# Patient Record
Sex: Male | Born: 2000 | Race: White | Hispanic: No | Marital: Single | Smoking: Former smoker
Health system: Southern US, Community
[De-identification: ages and names within clinical notes are randomized; demographics above are authoritative.]

## PROBLEM LIST (undated history)

## (undated) DIAGNOSIS — R631 Polydipsia: Secondary | ICD-10-CM

## (undated) DIAGNOSIS — F419 Anxiety disorder, unspecified: Secondary | ICD-10-CM

## (undated) DIAGNOSIS — F329 Major depressive disorder, single episode, unspecified: Secondary | ICD-10-CM

## (undated) DIAGNOSIS — R058 Other specified cough: Secondary | ICD-10-CM

## (undated) DIAGNOSIS — M791 Myalgia, unspecified site: Secondary | ICD-10-CM

## (undated) DIAGNOSIS — F32A Depression, unspecified: Secondary | ICD-10-CM

## (undated) DIAGNOSIS — H9190 Unspecified hearing loss, unspecified ear: Secondary | ICD-10-CM

## (undated) DIAGNOSIS — R05 Cough: Secondary | ICD-10-CM

## (undated) HISTORY — DX: Polydipsia: R63.1

## (undated) HISTORY — DX: Depression, unspecified: F32.A

## (undated) HISTORY — DX: Other specified cough: R05.8

## (undated) HISTORY — DX: Myalgia, unspecified site: M79.10

## (undated) HISTORY — DX: Unspecified hearing loss, unspecified ear: H91.90

## (undated) HISTORY — DX: Anxiety disorder, unspecified: F41.9

---
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
## 2020-04-17 ENCOUNTER — Encounter: Payer: Self-pay | Admitting: Family Medicine

## 2020-04-17 ENCOUNTER — Other Ambulatory Visit: Payer: Self-pay

## 2020-04-17 ENCOUNTER — Ambulatory Visit (INDEPENDENT_AMBULATORY_CARE_PROVIDER_SITE_OTHER): Payer: Managed Care, Other (non HMO) | Admitting: Family Medicine

## 2020-04-17 DIAGNOSIS — N50819 Testicular pain, unspecified: Secondary | ICD-10-CM

## 2020-04-17 NOTE — Assessment & Plan Note (Signed)
Appear to have R hydrocele.  No solid nodules or cystic structures palpated Recommend wearing supportive underwear.  Let me know if having new or worsening symptoms.

## 2020-04-17 NOTE — Patient Instructions (Signed)
It you notice increased swelling, pain or any firm nodules on the testicle please let me know.    Hydrocele, Adult A hydrocele is a collection of fluid in the loose pouch of skin that holds the testicles (scrotum). This may happen because:  The amount of fluid produced in the scrotum is not absorbed by the rest of the body.  Fluid from the abdomen fills the scrotum. Normally, the testicles develop in the abdomen then move (drop) into to the scrotum before birth. The tube that the testicles travel through usually closes after the testicles drop. If the tube does not close, fluid from the abdomen can fill the scrotum. This is less common in adults. What are the causes? The cause of a hydrocele in adults is usually not known. However, it may be caused by:  An injury to the scrotum.  An infection (epididymitis).  Decreased blood flow to the scrotum.  Twisting of a testicle (testicular torsion).  A birth defect.  A tumor or cancer of the testicle. What are the signs or symptoms? A hydrocele feels like a water-filled balloon. It may also feel heavy. Other symptoms include:  Swelling of the scrotum. The swelling may decrease when you lie down. You may also notice more swelling at night than in the morning.  Swelling of the groin.  Mild discomfort in the scrotum.  Pain. This can develop if the hydrocele was caused by infection or twisting. The larger the hydrocele, the more likely you are to have pain. How is this diagnosed? This condition may be diagnosed based on:  Physical exam.  Medical history. You may also have other tests, including:  Imaging tests, such as ultrasound.  Blood or urine tests. How is this treated? Most hydroceles go away on their own. If you have no discomfort or pain, your health care provider may suggest close monitoring of your condition (called watch and wait or watchful waiting) until the condition goes away or symptoms develop. If treatment is needed,  it may include:  Treating an underlying condition. This may include using an antibiotic medicine to treat an infection.  Surgery to stop fluid from collecting in the scrotum.  Surgery to drain the fluid. Options include: ? Needle aspiration. A needle is used to drain fluid. However, the fluid buildup will come back quickly. ? Hydrocelectomy. For this procedure, an incision is made in the scrotum to remove the fluid sac. Follow these instructions at home:  Watch the hydrocele for any changes.  Take over-the-counter and prescription medicines only as told by your health care provider.  If you were prescribed an antibiotic medicine, use it as told by your health care provider. Do not stop taking the antibiotic even if you start to feel better.  Keep all follow-up visits as told by your health care provider. This is important. Contact a health care provider if:  You notice any changes in the hydrocele.  The swelling in your scrotum or groin gets worse.  The hydrocele becomes red, firm, painful, or tender to the touch.  You have a fever. Get help right away if you:  Develop a lot of pain, or your pain becomes worse. Summary  A hydrocele is a collection of fluid in the loose pouch of skin that holds the testicles (scrotum).  Hydroceles can cause swelling, discomfort, and sometimes pain.  In adults, the cause of a hydrocele usually is not known. However, it is sometimes caused by an infection or a rotation and twisting of the  scrotum.  Treatment is usually not needed. Hydroceles often go away on their own. If a hydrocele causes pain, treatment may be given to ease the pain. This information is not intended to replace advice given to you by your health care provider. Make sure you discuss any questions you have with your health care provider. Document Revised: 12/23/2017 Document Reviewed: 12/23/2017 Elsevier Patient Education  2020 [employer].

## 2020-04-17 NOTE — Progress Notes (Signed)
Aaron Nicholson - 19 y.o. male MRN [id number]  Date of birth: 03/02/2001  Subjective Chief Complaint  Patient presents with  . New Patient (Initial Visit)    HPI Aaron Nicholson is a 19 y.o. male here today for initial visit.  He has complaint of testicular abnormality today.  Noticed a few weeks ago.  Not sure how long it has been present.  Area is above R testicle.  Soft, squishy feeling.  Denies pain but does have some discomfort if doing a lot of physical work.  He denies urinary symptoms, penile discharge, painful erections.  He has not had fever or chills.  He is adopted so he does not know family history.   ROS:  A comprehensive ROS was completed and negative except as noted per HPI  No Known Allergies  No past medical history on file.  No past surgical history on file.  Social History   Socioeconomic History  . Marital status: Single    Spouse name: Not on file  . Number of children: Not on file  . Years of education: Not on file  . Highest education level: Not on file  Occupational History  . Not on file  Tobacco Use  . Smoking status: Former Research scientist (life sciences)  . Smokeless tobacco: Never Used  Substance and Sexual Activity  . Alcohol use: No  . Drug use: No    Comment: Pt was ambiguous  . Sexual activity: Never  Other Topics Concern  . Not on file  Social History Narrative   Adopted ? 05/2004   Family History unknown mom was 71 at delivery   NGFS no concerns   HH of 4   Pet kittens      Social Determinants of Radio broadcast assistant Strain:   . Difficulty of Paying Living Expenses:   Food Insecurity:   . Worried About Charity fundraiser in the Last Year:   . Arboriculturist in the Last Year:   Transportation Needs:   . Film/video editor (Medical):   Marland Kitchen Lack of Transportation (Non-Medical):   Physical Activity:   . Days of Exercise per Week:   . Minutes of Exercise per Session:   Stress:   . Feeling of Stress :   Social Connections:   .  Frequency of Communication with Friends and Family:   . Frequency of Social Gatherings with Friends and Family:   . Attends Religious Services:   . Active Member of Clubs or Organizations:   . Attends Archivist Meetings:   Marland Kitchen Marital Status:     Family History  Adopted: Yes  Family history unknown: Yes    Health Maintenance  Topic Date Due  . HIV Screening  Never done  . COVID-19 Vaccine (1) Never done  . INFLUENZA VACCINE  07/26/2020  . TETANUS/TDAP  08/15/2021     ----------------------------------------------------------------------------------------------------------------------------------------------------------------------------------------------------------------- Physical Exam BP 111/77   Pulse 84   Temp (!) 97.4 F (36.3 C) (Oral)   Ht 5' 8.5" (1.74 m)   Wt 186 lb (84.4 kg)   BMI 27.87 kg/m   Physical Exam Constitutional:      Appearance: Normal appearance.  HENT:     Head: Normocephalic and atraumatic.  Cardiovascular:     Rate and Rhythm: Normal rate and regular rhythm.  Pulmonary:     Effort: Pulmonary effort is normal.     Breath sounds: Normal breath sounds.  Abdominal:     Palpations: Abdomen is soft.  Genitourinary:    Comments: Ballotable area around/adjacent to R testicle consistent with hydrocele.  No nodules on testicle. Epididymis is non tender.  Skin:    General: Skin is warm and dry.  Neurological:     General: No focal deficit present.     Mental Status: He is alert.  Psychiatric:        Mood and Affect: Mood normal.        Behavior: Behavior normal.     ------------------------------------------------------------------------------------------------------------------------------------------------------------------------------------------------------------------- Assessment and Plan  Testicular discomfort Appear to have R hydrocele.  No solid nodules or cystic structures palpated Recommend wearing supportive underwear.   Let me know if having new or worsening symptoms.    No orders of the defined types were placed in this encounter.   No follow-ups on file.    This visit occurred during the SARS-CoV-2 public health emergency.  Safety protocols were in place, including screening questions prior to the visit, additional usage of staff PPE, and extensive cleaning of exam room while observing appropriate contact time as indicated for disinfecting solutions.

## 2020-09-03 ENCOUNTER — Ambulatory Visit (INDEPENDENT_AMBULATORY_CARE_PROVIDER_SITE_OTHER): Payer: Managed Care, Other (non HMO) | Admitting: Family Medicine

## 2020-09-03 ENCOUNTER — Other Ambulatory Visit: Payer: Self-pay

## 2020-09-03 ENCOUNTER — Ambulatory Visit (INDEPENDENT_AMBULATORY_CARE_PROVIDER_SITE_OTHER): Payer: Managed Care, Other (non HMO)

## 2020-09-03 ENCOUNTER — Encounter: Payer: Self-pay | Admitting: Family Medicine

## 2020-09-03 VITALS — BP 144/72 | HR 90 | Wt 172.6 lb

## 2020-09-03 DIAGNOSIS — N50812 Left testicular pain: Secondary | ICD-10-CM | POA: Diagnosis not present

## 2020-09-03 DIAGNOSIS — N50819 Testicular pain, unspecified: Secondary | ICD-10-CM | POA: Diagnosis not present

## 2020-09-03 DIAGNOSIS — Z113 Encounter for screening for infections with a predominantly sexual mode of transmission: Secondary | ICD-10-CM

## 2020-09-03 NOTE — Assessment & Plan Note (Signed)
He has some mild epididymal tenderness.  No solid masses noted.  Given recurrent pain, will obtain US with doppler.  Check GC/chlamydia.  If no significant structural abnormalities noted on [place] will cover for epididymitis with doxycycline.

## 2020-09-03 NOTE — Progress Notes (Signed)
Aaron Nicholson - 19 y.o. male MRN [id number]  Date of birth: 24-Jul-2001  Subjective Chief Complaint  Patient presents with  . Testicle Pain    HPI Aaron Nicholson is a 19 y.o. male here today with complaint of testicular pain.  He was seen in 03/2020 with some mild discomfort but no pain at that time.  We discussed [place] at that time but decided to just monitor for now.  He denies any other symptoms including fever, chills, nausea or vomiting, penile discharge.   ROS:  A comprehensive ROS was completed and negative except as noted per HPI  No Known Allergies  Past Medical History:  Diagnosis Date  . Anxiety   . Depression   . Dry cough   . Increased thirst   . Muscle pain   . Perceived hearing changes     History reviewed. No pertinent surgical history.  Social History   Socioeconomic History  . Marital status: Single    Spouse name: Not on file  . Number of children: Not on file  . Years of education: Not on file  . Highest education level: Not on file  Occupational History  . Not on file  Tobacco Use  . Smoking status: Former Games developer  . Smokeless tobacco: Never Used  Vaping Use  . Vaping Use: Some days  Substance and Sexual Activity  . Alcohol use: No  . Drug use: Yes    Types: Marijuana  . Sexual activity: Not Currently    Partners: Female    Birth control/protection: Condom  Other Topics Concern  . Not on file  Social History Narrative   Adopted ? 05/2004   Family History unknown mom was 18 at delivery   NGFS no concerns   HH of 4   Pet kittens      Social Determinants of Health   Financial Resource Strain:   . Difficulty of Paying Living Expenses: Not on file  Food Insecurity:   . Worried About Programme researcher, broadcasting/film/video in the Last Year: Not on file  . Ran Out of Food in the Last Year: Not on file  Transportation Needs:   . Lack of Transportation (Medical): Not on file  . Lack of Transportation (Non-Medical): Not on file  Physical Activity:   .  Days of Exercise per Week: Not on file  . Minutes of Exercise per Session: Not on file  Stress:   . Feeling of Stress : Not on file  Social Connections:   . Frequency of Communication with Friends and Family: Not on file  . Frequency of Social Gatherings with Friends and Family: Not on file  . Attends Religious Services: Not on file  . Active Member of Clubs or Organizations: Not on file  . Attends Banker Meetings: Not on file  . Marital Status: Not on file    Family History  Adopted: Yes  Family history unknown: Yes    Health Maintenance  Topic Date Due  . Hepatitis C Screening  Never done  . INFLUENZA VACCINE  Never done  . COVID-19 Vaccine (1) 11/25/2020 (Originally 06/23/2012)  . TETANUS/TDAP  08/15/2021  . HIV Screening  Discontinued     ----------------------------------------------------------------------------------------------------------------------------------------------------------------------------------------------------------------- Physical Exam BP (!) 144/72 (BP Location: Left Arm, Patient Position: Sitting, Cuff Size: Normal)   Pulse 90   Wt 172 lb 9.6 oz (78.3 kg)   SpO2 99%   BMI 25.86 kg/m   Physical Exam Constitutional:      Appearance: Normal  appearance.  HENT:     Head: Normocephalic and atraumatic.  Cardiovascular:     Rate and Rhythm: Normal rate and regular rhythm.  Pulmonary:     Effort: Pulmonary effort is normal.     Breath sounds: Normal breath sounds.  Genitourinary:    Comments: Testicle without mass or significant ttp. He does have some mild epididymal tenderness.  No hernias noted.   Fordyce spots noted on shaft of penis (he was concerned about these).   Neurological:     General: No focal deficit present.     Mental Status: He is alert.  Psychiatric:        Mood and Affect: Mood normal.        Behavior: Behavior normal.      ------------------------------------------------------------------------------------------------------------------------------------------------------------------------------------------------------------------- Assessment and Plan  Left testicular pain He has some mild epididymal tenderness.  No solid masses noted.  Given recurrent pain, will obtain US with doppler.  Check GC/chlamydia.  If no significant structural abnormalities noted on [place] will cover for epididymitis with doxycycline.     No orders of the defined types were placed in this encounter.  Orders Placed This Encounter  Procedures  . Chlamydia/Neisseria Gonorrhoeae RNA,TMA,Urogenital  . US SCROTUM W/DOPPLER    Standing Status:   Future    Standing Expiration Date:   09/03/2021    Order Specific Question:   Reason for Exam (SYMPTOM  OR DIAGNOSIS REQUIRED)    Answer:   Left testicular pain    Order Specific Question:   Preferred imaging location?    Answer:   [facility]  . HIV antibody (with reflex)  . RPR     No follow-ups on file.    This visit occurred during the SARS-CoV-2 public health emergency.  Safety protocols were in place, including screening questions prior to the visit, additional usage of staff PPE, and extensive cleaning of exam room while observing appropriate contact time as indicated for disinfecting solutions.

## 2020-09-04 ENCOUNTER — Other Ambulatory Visit: Payer: Self-pay | Admitting: Family Medicine

## 2020-09-04 MED ORDER — DOXYCYCLINE HYCLATE 100 MG PO TABS: 100.0000 mg | ORAL_TABLET | Freq: Two times a day (BID) | ORAL | 0 refills | Status: DC

## 2020-09-05 LAB — SYPHILIS: RPR W/REFLEX TO RPR TITER AND TREPONEMAL ANTIBODIES, TRADITIONAL SCREENING AND DIAGNOSIS ALGORITHM: RPR Ser Ql: NONREACTIVE

## 2020-09-05 LAB — CHLAMYDIA/NEISSERIA GONORRHOEAE RNA,TMA,UROGENTIAL
C. trachomatis RNA, TMA: NOT DETECTED
N. gonorrhoeae RNA, TMA: NOT DETECTED

## 2020-09-05 LAB — HIV ANTIBODY (ROUTINE TESTING W REFLEX): HIV 1&2 Ab, 4th Generation: NONREACTIVE

## 2020-12-14 ENCOUNTER — Other Ambulatory Visit: Payer: Self-pay

## 2020-12-14 ENCOUNTER — Other Ambulatory Visit: Payer: Self-pay | Admitting: Family Medicine

## 2020-12-14 MED ORDER — DOXYCYCLINE HYCLATE 100 MG PO TABS
100.0000 mg | ORAL_TABLET | Freq: Two times a day (BID) | ORAL | 0 refills | Status: DC
Start: 2020-12-14 — End: 2021-03-24

## 2020-12-14 NOTE — Telephone Encounter (Signed)
Pt is requesting re-evaluation of med refill.   Advised pt the refill had been denied. He says, "I think that I still have this problem because I threw up most of the medication before. Can you please ask him to refill it for me. It was working but not enough of it was kept in my system."  Please advise.

## 2020-12-14 NOTE — Telephone Encounter (Signed)
Patient has been advised of refill stipulations and conditions. Agrees to be seen if not improving after this round.

## 2021-02-10 IMAGING — US US SCROTUM W/ DOPPLER COMPLETE
1 series · 14 of 25 positions shown · non-contrast
Comparison: None.

CLINICAL DATA: Left testicular pain worsening since [DATE]

EXAM:
SCROTAL ULTRASOUND
DOPPLER ULTRASOUND OF THE TESTICLES
TECHNIQUE: Complete ultrasound examination of the testicles, epididymis, and
other scrotal structures was performed. Color and spectral Doppler
ultrasound were also utilized to evaluate blood flow to the
testicles.

[Series 1: us scrotum w/ doppler complete · 0.06mm/px · 14 of 57 slices shown]
[im 1/57]
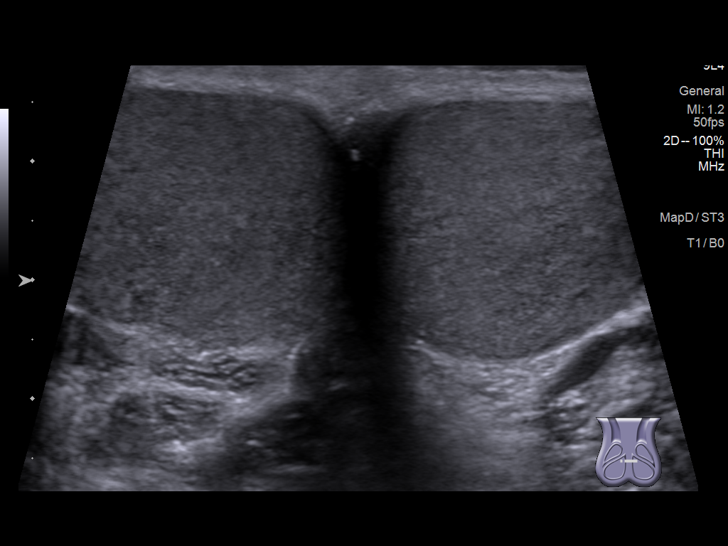
[im 5/57]
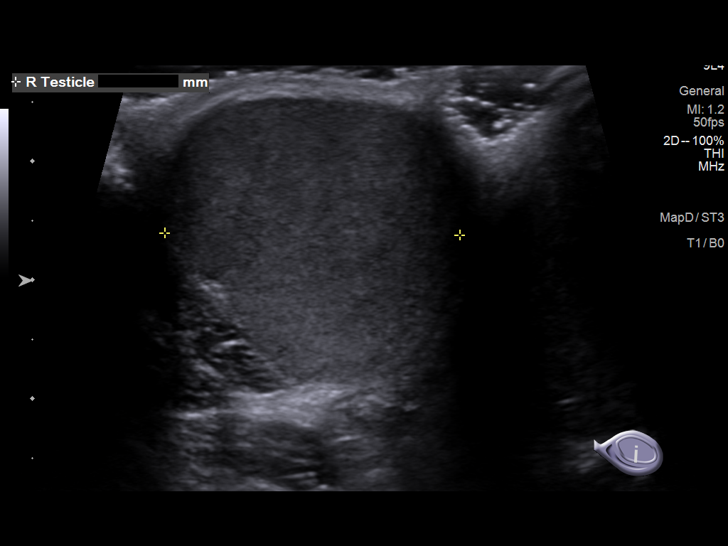
[im 10/57]
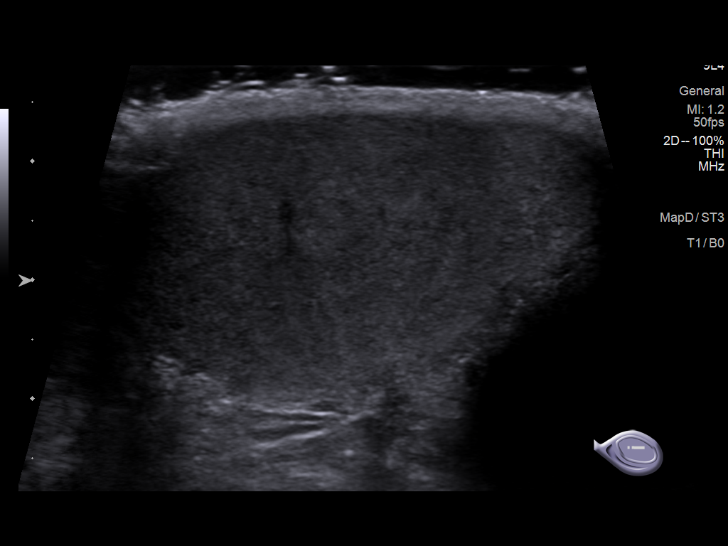
[im 15/57]
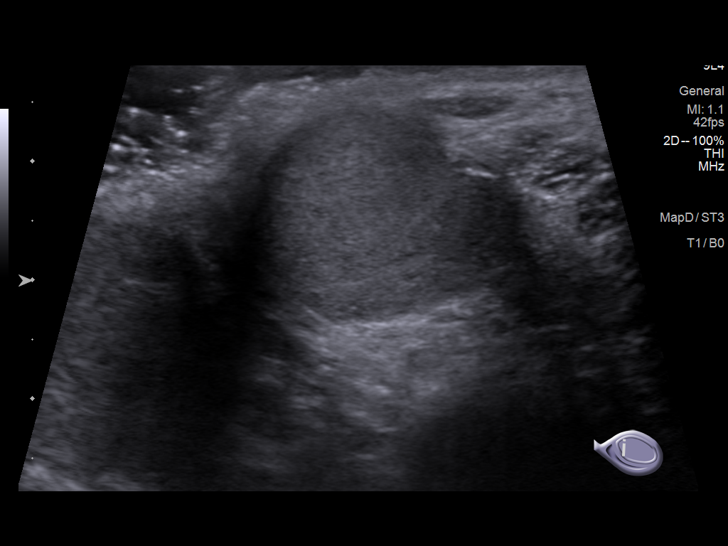
[im 19/57]
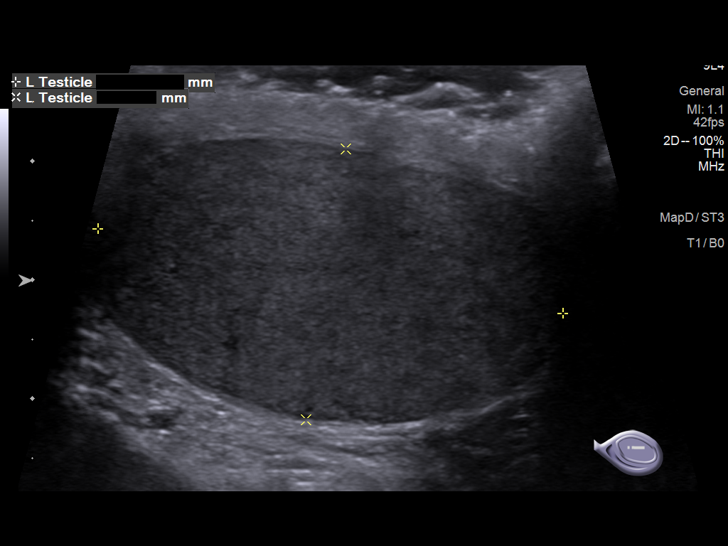
[im 22/57]
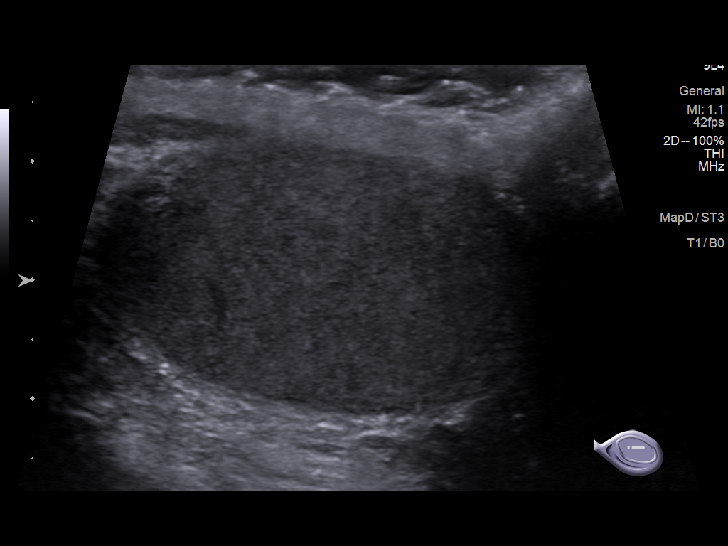
[im 26/57]
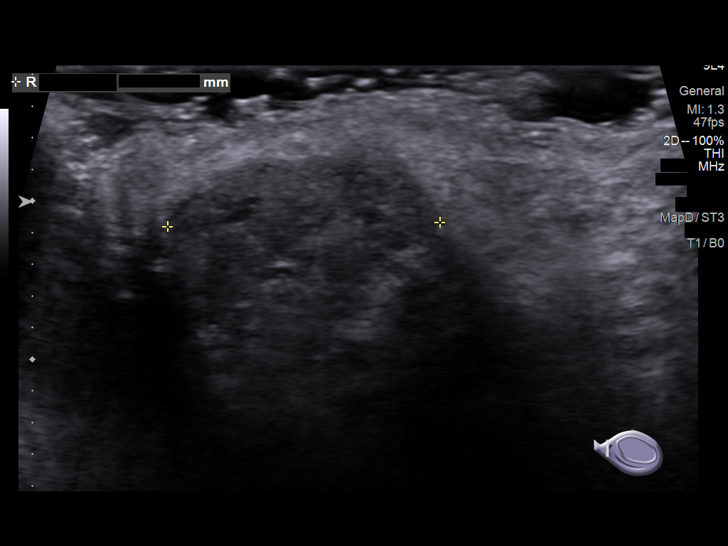
[im 31/57]
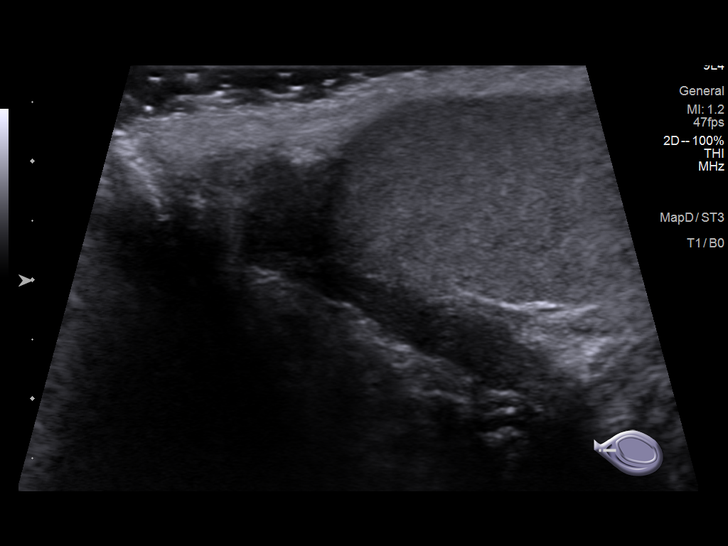
[im 36/57]
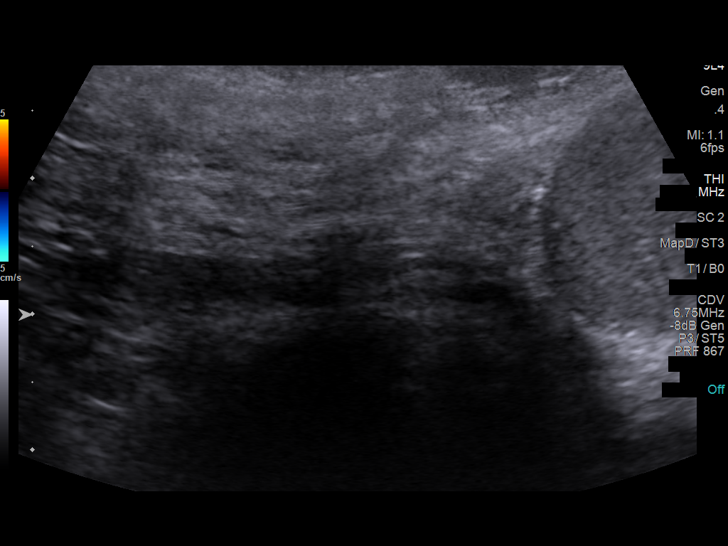
[im 38/57]
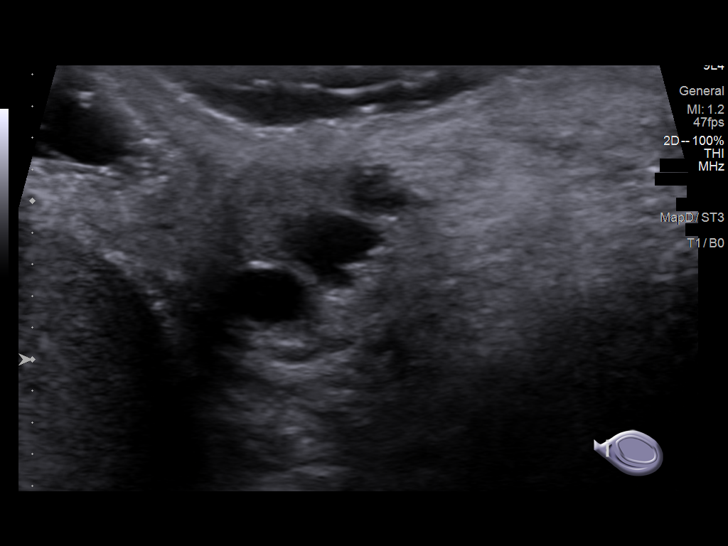
[im 43/57]
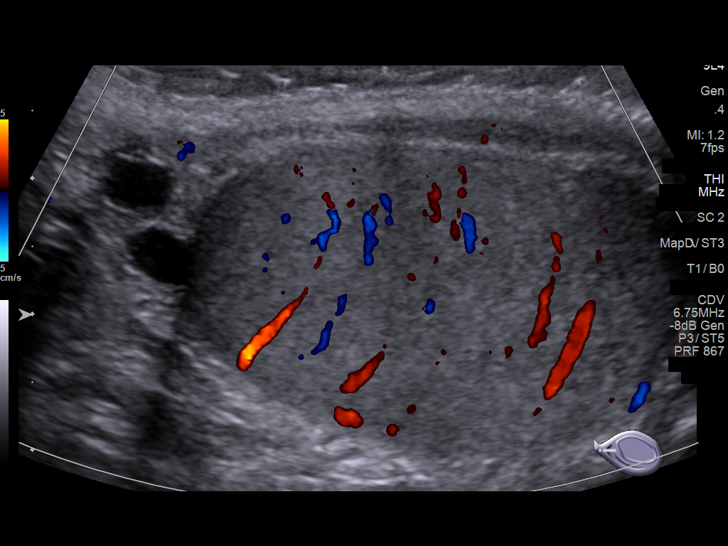
[im 47/57]
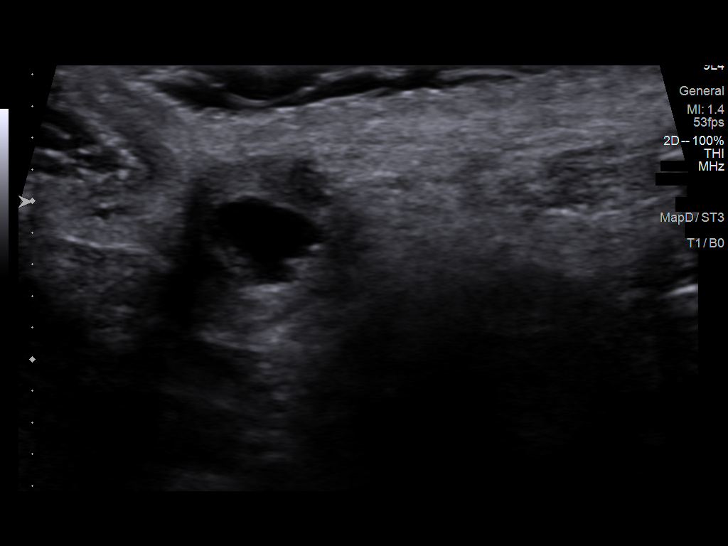
[im 52/57]
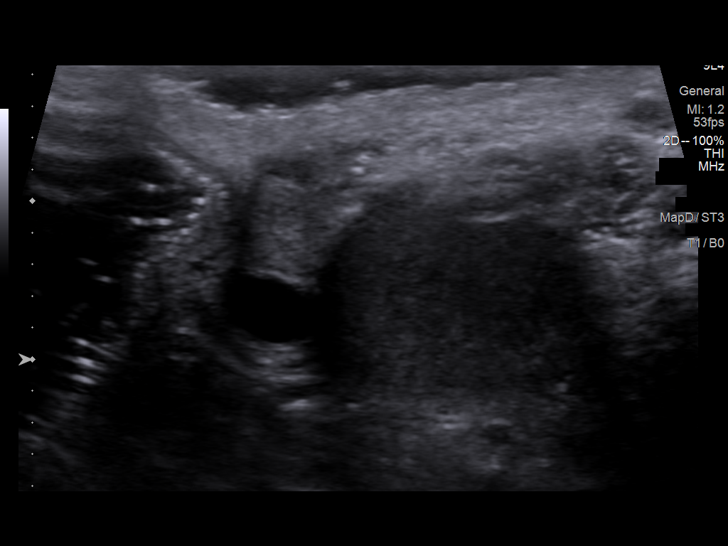
[im 57/57]
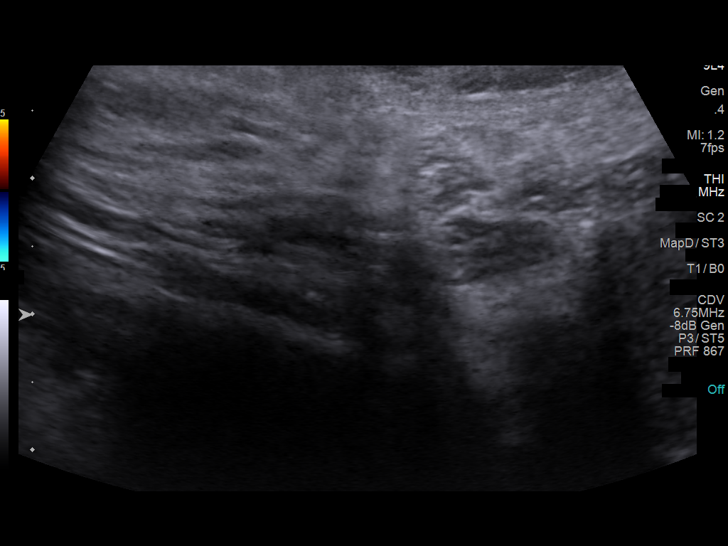

[14 of 25 positions shown; findings below may reference images not displayed]

FINDINGS: Right testicle

Measurements: 4.5 x 2.5 x 2.5 cm. No mass or microlithiasis
visualized.

Left testicle

Measurements: 4 x 2.3 x 2.4 cm. No mass or microlithiasis
visualized.

Right epididymis:  Normal in size and appearance.

Left epididymis: Multiple epididymal head cysts with the largest
measuring 0.5 x 0.5 x 0.8 cm.

Hydrocele:  None visualized.

Varicocele:  None visualized.

Pulsed Doppler interrogation of both testes demonstrates normal low
resistance arterial and venous waveforms bilaterally.
IMPRESSION: Unremarkable bilatreal scrotal ultrasound.

## 2021-03-24 ENCOUNTER — Telehealth: Payer: Self-pay

## 2021-03-24 ENCOUNTER — Encounter: Payer: Self-pay | Admitting: Family Medicine

## 2021-03-24 ENCOUNTER — Telehealth (INDEPENDENT_AMBULATORY_CARE_PROVIDER_SITE_OTHER): Payer: Self-pay | Admitting: Family Medicine

## 2021-03-24 DIAGNOSIS — J301 Allergic rhinitis due to pollen: Secondary | ICD-10-CM

## 2021-03-24 NOTE — Telephone Encounter (Signed)
Was not able to reach patient and no voicemail set up.

## 2021-03-24 NOTE — Telephone Encounter (Signed)
Patient called to schedule an appt for runny nose and ear pressure.   Please call and schedule. Thanks

## 2021-03-24 NOTE — Assessment & Plan Note (Signed)
Symptoms seem most consistent with allergic rhinitis/seasonal allergies.  Recommend adding daily zyrtec with daily flonase.  He should use throughout peak allergy season.  Instructed to follow up if symptoms worsen or are not improving as expected with treatment.

## 2021-03-24 NOTE — Progress Notes (Signed)
Aaron Nicholson - 20 y.o. male MRN [id number]  Date of birth: [birth year]   This visit type was conducted due to national recommendations for restrictions regarding the COVID-19 Pandemic (e.g. social distancing).  This format is felt to be most appropriate for this patient at this time.  All issues noted in this document were discussed and addressed.  No physical exam was performed (except for noted visual exam findings with Video Visits).  I discussed the limitations of evaluation and management by telemedicine and the availability of in person appointments. The patient expressed understanding and agreed to proceed.  I connected with@ on 03/24/21 at  3:40 PM EDT by a video enabled telemedicine application and verified that I am speaking with the correct person using two identifiers.  Interactive audio and video telecommunications were attempted between this provider and patient, however failed, due to patient having technical difficulties OR patient did not have access to video capability.  We continued and completed visit with audio only.    Present at visit: Everrett Coombe, DO Delana Meyer Brandi   Patient Location: Home [address] [place] [address]   Provider location:   [facility] [place]  Chief Complaint  Patient presents with  . Nasal Congestion    HPI  Aaron Nicholson is a 19 y.o. male who presents via audio/video conferencing for a telehealth visit today.  He has complaint of congestion and ear pressure.  Symptoms present x3 weeks.  Mucus is clear and thin.  He denies headache, cough, shortness of breath, fever or chills.  He has not tried anything for treatment so far.    ROS:  A comprehensive ROS was completed and negative except as noted per HPI  Past Medical History:  Diagnosis Date  . Anxiety   . Depression   . Dry cough   . Increased thirst   . Muscle pain   . Perceived hearing changes     History reviewed. No pertinent surgical history.  Family History   Adopted: Yes  Family history unknown: Yes    Social History   Socioeconomic History  . Marital status: Single    Spouse name: Not on file  . Number of children: Not on file  . Years of education: Not on file  . Highest education level: Not on file  Occupational History  . Not on file  Tobacco Use  . Smoking status: Former Games developer  . Smokeless tobacco: Never Used  Vaping Use  . Vaping Use: Some days  Substance and Sexual Activity  . Alcohol use: No  . Drug use: Yes    Types: Marijuana  . Sexual activity: Not Currently    Partners: Female    Birth control/protection: Condom  Other Topics Concern  . Not on file  Social History Narrative   Adopted ? 05/2004   Family History unknown mom was 18 at delivery   NGFS no concerns   HH of 4   Pet kittens      Social Determinants of Corporate investment banker Strain: Not on file  Food Insecurity: Not on file  Transportation Needs: Not on file  Physical Activity: Not on file  Stress: Not on file  Social Connections: Not on file  Intimate Partner Violence: Not on file     Current Outpatient Medications:  .  omeprazole (PRILOSEC) 20 MG capsule, Take by mouth., Disp: , Rfl:   EXAM:  VITALS per patient if applicable: Ht 5' 8.5" (1.74 m)   Wt 172  lb 9.6 oz (78.3 kg)   BMI 25.86 kg/m   GENERAL: alert, oriented,  in no acute distress  PSYCH/NEURO: pleasant and cooperative, no obvious depression or anxiety, speech and thought processing grossly intact  ASSESSMENT AND PLAN:  Discussed the following assessment and plan:  Allergic rhinitis Symptoms seem most consistent with allergic rhinitis/seasonal allergies.  Recommend adding daily zyrtec with daily flonase.  He should use throughout peak allergy season.  Instructed to follow up if symptoms worsen or are not improving as expected with treatment.    22 minutes spent including pre visit preparation, review of prior notes and labs, encounter with patient via video visit  and same day documentation.     I discussed the assessment and treatment plan with the patient. The patient was provided an opportunity to ask questions and all were answered. The patient agreed with the plan and demonstrated an understanding of the instructions.   The patient was advised to call back or seek an in-person evaluation if the symptoms worsen or if the condition fails to improve as anticipated.    Everrett Coombe, DO
